# Patient Record
Sex: Male | Born: 1980 | Race: White | Hispanic: No | Marital: Single | Smoking: Never smoker
Health system: Southern US, Community
[De-identification: ages and names within clinical notes are randomized; demographics above are authoritative.]

## PROBLEM LIST (undated history)

## (undated) HISTORY — PX: NO PAST SURGERIES: SHX2092

---

## 2013-11-11 ENCOUNTER — Other Ambulatory Visit: Payer: Self-pay | Admitting: Occupational Medicine

## 2013-11-11 ENCOUNTER — Ambulatory Visit
Admission: RE | Admit: 2013-11-11 | Discharge: 2013-11-11 | Disposition: A | Discharge status: Home / Self Care | Payer: No Typology Code available for payment source | Source: Ambulatory Visit | Attending: Occupational Medicine | Admitting: Occupational Medicine

## 2013-11-11 DIAGNOSIS — Z021 Encounter for pre-employment examination: Secondary | ICD-10-CM

## 2015-04-16 IMAGING — CR DG CHEST 1V
1 series · 1 of 1 positions shown · non-contrast
Comparison: None.

CLINICAL DATA: CXR smoker.  Chest x-ray for physical exam.

EXAM:
CHEST - 1 VIEW

[view not recorded]
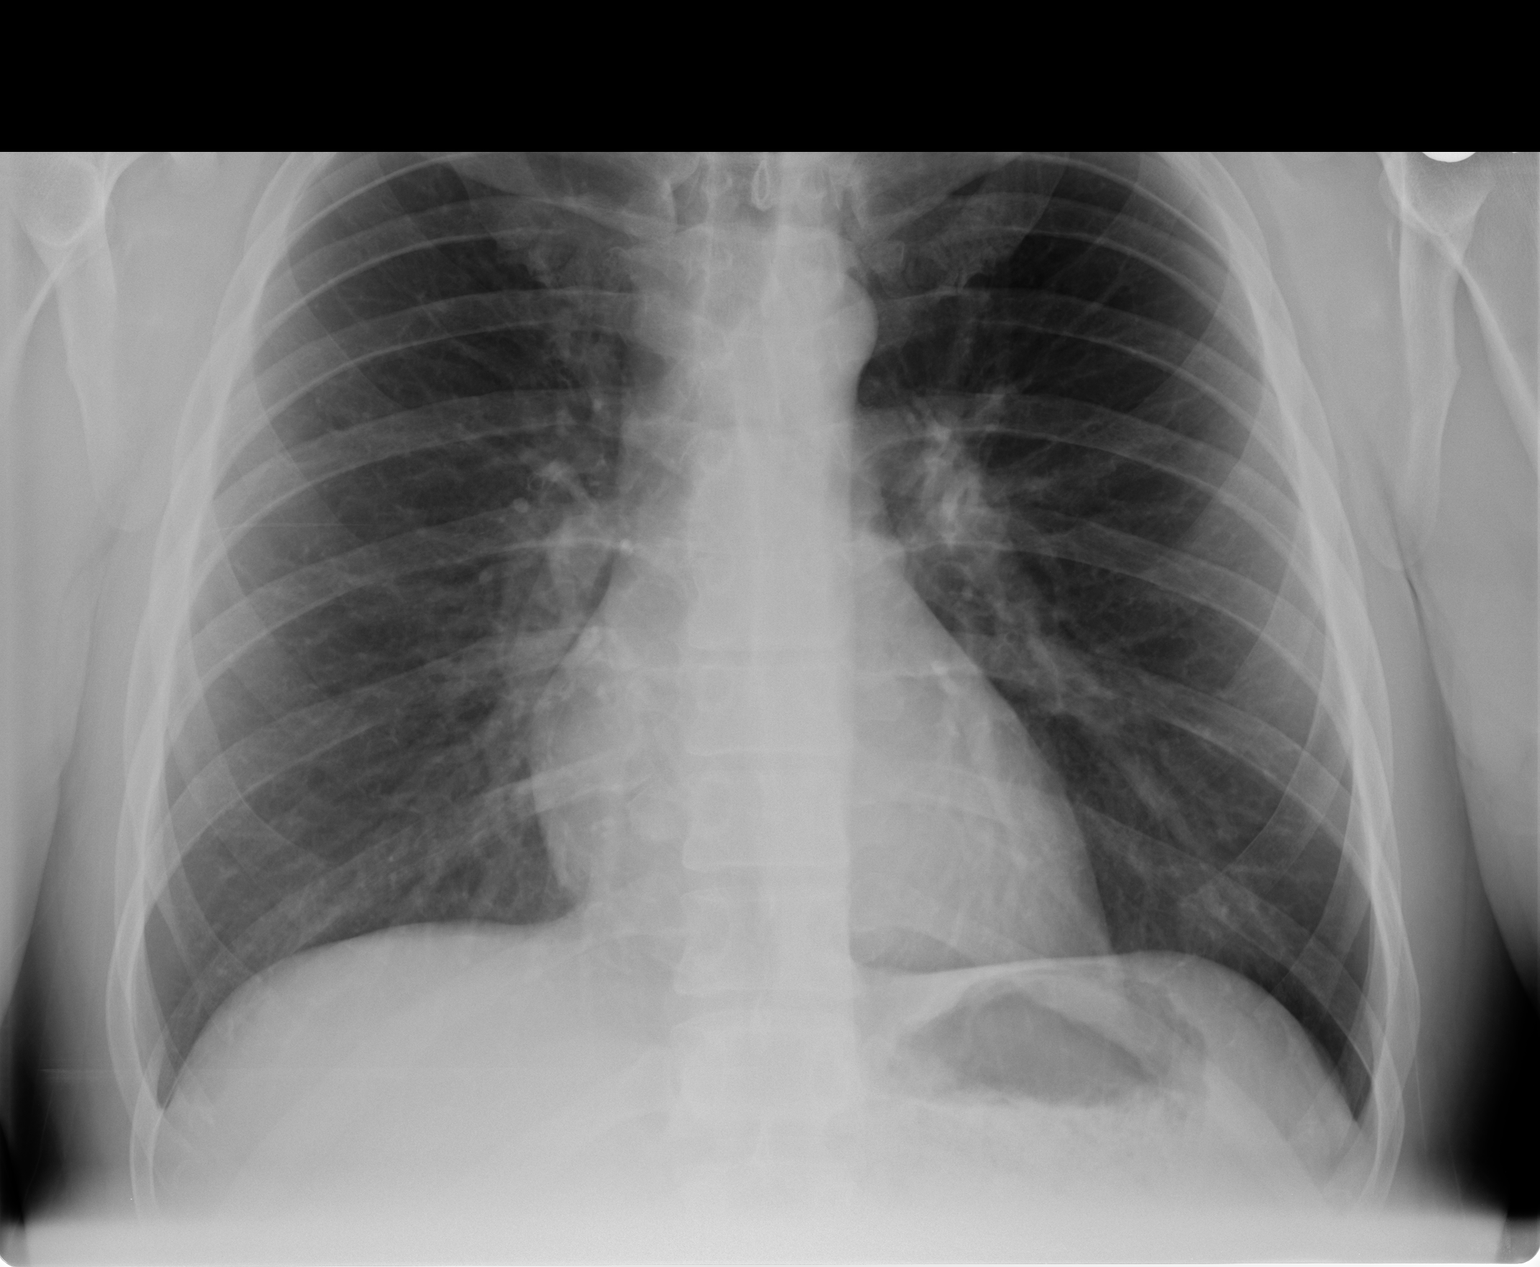

[1 of 1 positions shown; findings below may reference images not displayed]

FINDINGS: The heart size and mediastinal contours are within normal limits.
Both lungs are clear. The visualized skeletal structures are
unremarkable.
IMPRESSION: No active disease.

## 2017-03-31 ENCOUNTER — Ambulatory Visit: Payer: Self-pay

## 2017-03-31 ENCOUNTER — Other Ambulatory Visit: Payer: Self-pay | Admitting: Occupational Medicine

## 2017-03-31 DIAGNOSIS — M79645 Pain in left finger(s): Secondary | ICD-10-CM

## 2018-09-03 IMAGING — DX DG FINGER MIDDLE 2+V*L*
3 series · 3 of 3 positions shown · non-contrast
Comparison: None.

CLINICAL DATA: Left middle finger pain after twisting injury.

EXAM:
LEFT MIDDLE FINGER 2+V

[finger pa]
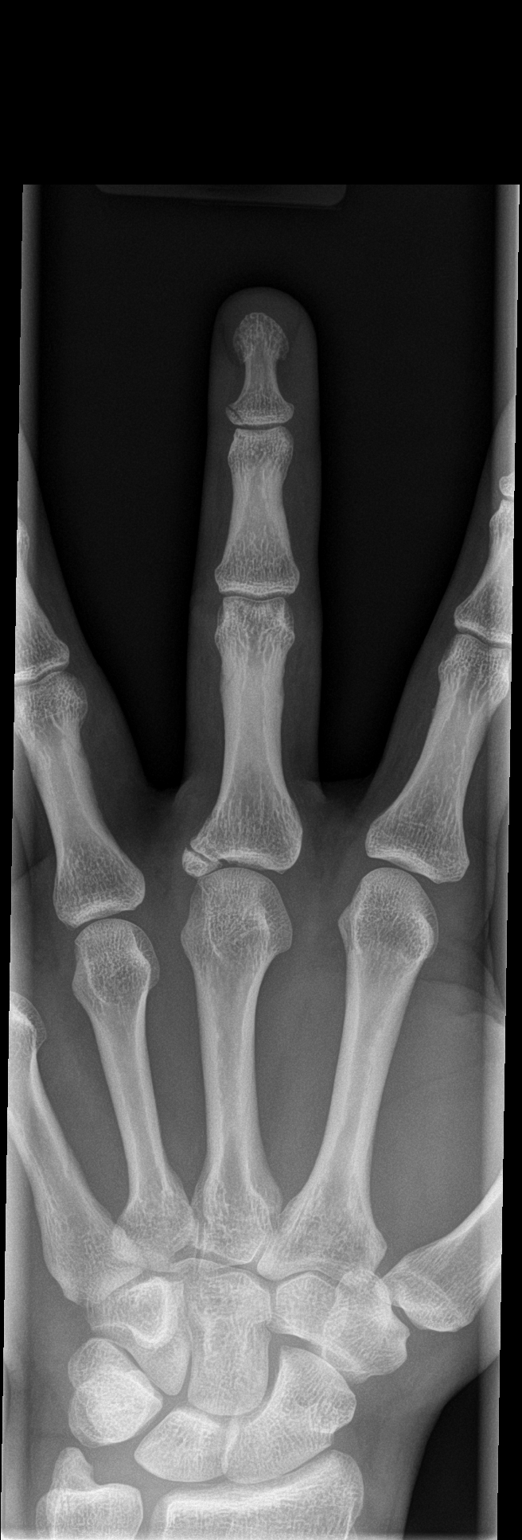

[finger obl]
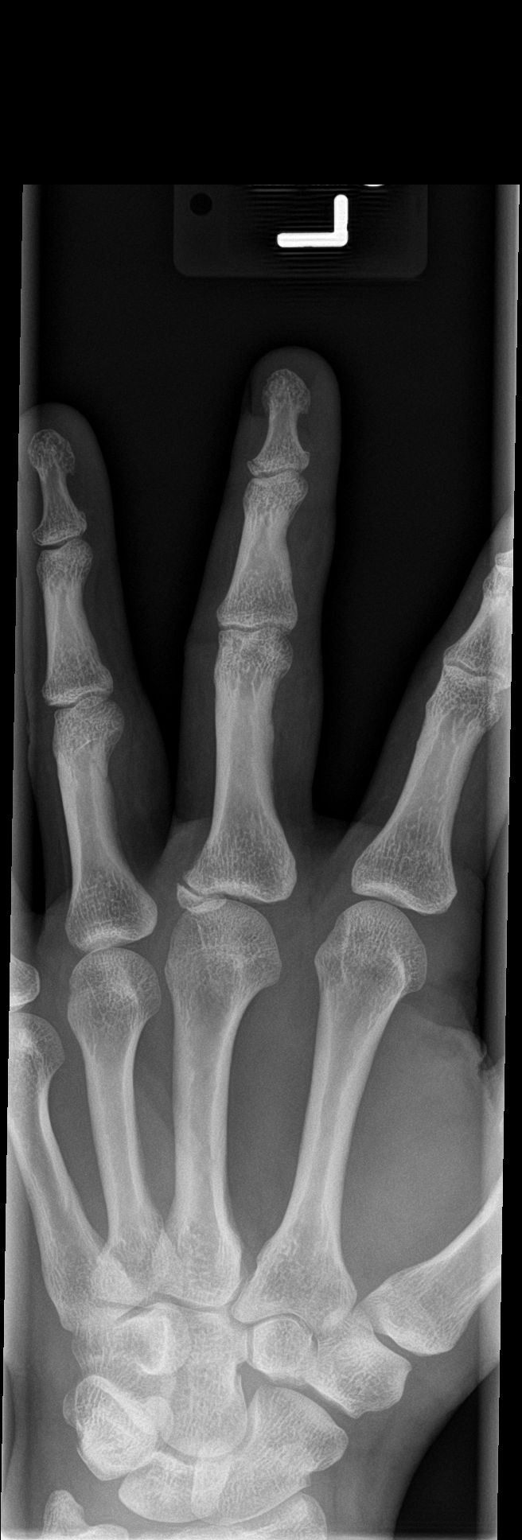

[finger lat]
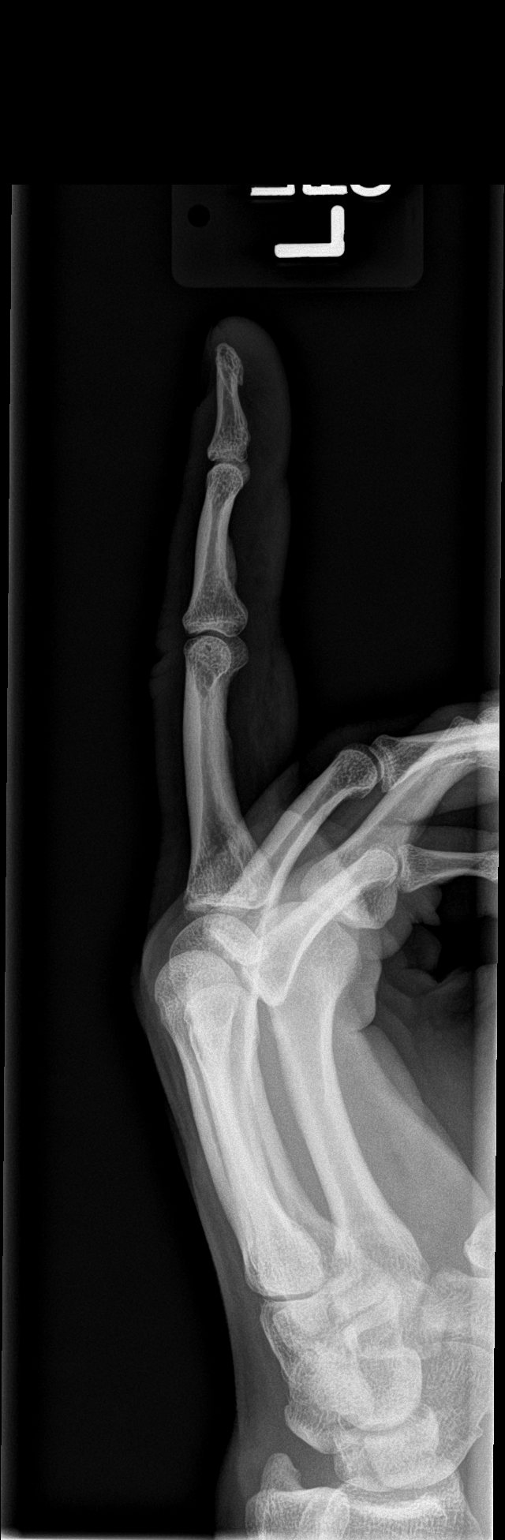

[3 of 3 positions shown; findings below may reference images not displayed]

FINDINGS: There is an acute, nondisplaced, intra-articular fracture involving
the ulnar base of the middle finger distal phalanx. There is a well
corticated ossific density at the ulnar base of the middle finger
proximal phalanx. Joint spaces are preserved. Bone mineralization is
normal. Soft tissues are unremarkable.
IMPRESSION: 1. Acute, nondisplaced, intra-articular fracture at the ulnar base
of the middle finger distal phalanx.
2. Well corticated ossific density at the ulnar base of the middle
finger proximal phalanx is favored to be related to prior trauma.
Correlate with point tenderness.

## 2021-08-13 ENCOUNTER — Encounter: Payer: Self-pay | Admitting: Internal Medicine

## 2021-08-24 ENCOUNTER — Encounter: Payer: Self-pay | Admitting: Internal Medicine

## 2021-09-12 ENCOUNTER — Other Ambulatory Visit (INDEPENDENT_AMBULATORY_CARE_PROVIDER_SITE_OTHER): Payer: 59

## 2021-09-12 ENCOUNTER — Encounter: Payer: Self-pay | Admitting: Gastroenterology

## 2021-09-12 ENCOUNTER — Ambulatory Visit: Payer: 59 | Admitting: Gastroenterology

## 2021-09-12 VITALS — BP 136/88 | HR 79 | Ht 73.5 in | Wt 256.6 lb

## 2021-09-12 DIAGNOSIS — R1013 Epigastric pain: Secondary | ICD-10-CM

## 2021-09-12 LAB — H. PYLORI ANTIBODY, IGG: H Pylori IgG: NEGATIVE

## 2021-09-12 MED ORDER — DICYCLOMINE HCL 10 MG PO CAPS: 20.0000 mg | ORAL_CAPSULE | Freq: Four times a day (QID) | ORAL | 0 refills | Status: AC

## 2021-09-12 NOTE — Progress Notes (Signed)
HPI : Andrew Goodwin is a very pleasant 41 year old male with no significant past medical history who presents to our office today for further evaluation of recurrent upper abdominal pain.  Patient states that he first experienced the symptoms in 2009, and he was admitted to the hospital because of his severe pain.  He reports being told that he had sludge in his gallbladder, but was told that he did not need to have his gallbladder out.  Prior to the onset of this pain, he states that he had lost about 70 pounds in 3 months, and that he was told that his rapid weight loss probably contributed to his symptoms.  He he did not have any problems with abdominal pain until February of this year.  Since February, he has had 8 or 9 episodes of pain which she says are near identical to the pain that he had back in 2009.  His last episode was a couple of weeks ago and was characterized by severe upper abdominal pain in the epigastrium and right upper quadrant associated with profuse vomiting and retching.  Typically these symptoms will last for an hour or 2, but his most recent one lasted 10 hours.  He denies symptoms of fevers or chills.  He has not identified any dietary triggers for these attacks.  The pain is described as sharp and stabbing with a colicky component to it. In between these episodes, he denies any significant issues with abdominal discomfort or nausea, although since his last episode he has had a persistent vague mild upper abdominal discomfort that has not completely gone away. He denies any NSAID use. He reports that he has multiple family members that have had their gallbladders taken out. No lower GI symptoms.   History reviewed. No pertinent past medical history.   History reviewed. No pertinent surgical history. Family History  Problem Relation Age of Onset   Colon cancer Neg Hx    Esophageal cancer Neg Hx    Liver cancer Neg Hx    Pancreatic cancer Neg Hx    Rectal cancer Neg Hx     Social History   Tobacco Use   Smoking status: Never   Smokeless tobacco: Never  Vaping Use   Vaping Use: Never used  Substance Use Topics   Alcohol use: Yes    Alcohol/week: 2.0 standard drinks of alcohol    Types: 2 Standard drinks or equivalent per week    Comment: socially   Drug use: Never   No current outpatient medications on file.   No current facility-administered medications for this visit.   No Known Allergies   Review of Systems: All systems reviewed and negative except where noted in HPI.    No results found.  Physical Exam: BP 136/88 (BP Location: Left Arm, Patient Position: Sitting, Cuff Size: Normal)   Pulse 79   Ht 6' 1.5" (1.867 m)   Wt 256 lb 9.6 oz (116.4 kg)   SpO2 96%   BMI 33.40 kg/m  Constitutional: Pleasant,well-developed, Caucasian male in no acute distress. HEENT: Normocephalic and atraumatic. Conjunctivae are normal. No scleral icterus. Neck supple.  Cardiovascular: Normal rate, regular rhythm.  Pulmonary/chest: Effort normal and breath sounds normal. No wheezing, rales or rhonchi. Abdominal: Soft, nondistended, nontender. Bowel sounds active throughout. There are no masses palpable. No hepatomegaly. Extremities: no edema Neurological: Alert and oriented to person place and time. Skin: Skin is warm and dry. No rashes noted. Psychiatric: Normal mood and affect. Behavior is normal.  CBC  No results found for: "WBC", "RBC", "HGB", "HCT", "PLT", "MCV", "MCH", "MCHC", "RDW", "LYMPHSABS", "MONOABS", "EOSABS", "BASOSABS"  CMP  No results found for: "NA", "K", "CL", "CO2", "GLUCOSE", "BUN", "CREATININE", "CALCIUM", "PROT", "ALBUMIN", "AST", "ALT", "ALKPHOS", "BILITOT", "GFRNONAA", "GFRAA"   ASSESSMENT AND PLAN: 41 year old male with recurring episodes of severe epigastric/right upper quadrant pain associated with nausea and vomiting.  He has a history of similar symptoms 14 years ago which was attributed to gallbladder sludge.  Strongly  suspect his current symptoms are biliary in etiology.  We will get right upper quadrant ultrasound.  In the meantime will prescribe Bentyl as needed for his attacks.  We will get baseline labs (CBC, CMP, lipase) as well as H. pylori serology.  Epigastric pain, n/v, suspect GB - RUQUS - Bentyl - H pylori IgM - CBC, CMP, lipase  Yuridia Couts E. Tomasa Rand, MD [facility]    No ref. provider found

## 2021-09-12 NOTE — Patient Instructions (Signed)
Please go the basement lab when leaving today.   We have sent Bentyl to your pharmacy.   The schedulers will call you to set up the Ultrasound.  Please call [phone] if you have not heard from them in 1 week.

## 2021-09-17 ENCOUNTER — Ambulatory Visit (HOSPITAL_COMMUNITY): Admission: RE | Admit: 2021-09-17 | Payer: 59 | Source: Ambulatory Visit

## 2021-09-17 ENCOUNTER — Ambulatory Visit: Payer: Self-pay | Admitting: Internal Medicine

## 2021-09-19 ENCOUNTER — Ambulatory Visit (HOSPITAL_COMMUNITY)
Admission: RE | Admit: 2021-09-19 | Discharge: 2021-09-19 | Disposition: A | Discharge status: Home / Self Care | Payer: 59 | Source: Ambulatory Visit | Attending: Gastroenterology | Admitting: Gastroenterology

## 2021-09-19 DIAGNOSIS — R1013 Epigastric pain: Secondary | ICD-10-CM | POA: Diagnosis present

## 2021-09-19 NOTE — Progress Notes (Signed)
Andrew Goodwin,  Please contact Mr. Dimario and let him know that his US showed gallstones which are the likely cause of his abdominal pain. Please place a general surgery consult for symptomatic cholelithiasis.  Please also inform him that he has fatty change to his liver which can be related to excessive alcohol use or can be related to a diet with excessive sugar/fat.  I advise him to decrease alcohol consumption and limit consumption of sugar-based foods/beverage

## 2021-10-11 ENCOUNTER — Ambulatory Visit: Payer: Self-pay | Admitting: Surgery

## 2021-10-24 NOTE — Patient Instructions (Signed)
SURGICAL WAITING ROOM VISITATION Patients having surgery or a procedure may have no more than 2 support people in the waiting area - these visitors may rotate.   Children under the age of 9 must have an adult with them who is not the patient. If the patient needs to stay at the hospital during part of their recovery, the visitor guidelines for inpatient rooms apply. Pre-op nurse will coordinate an appropriate time for 1 support person to accompany patient in pre-op.  This support person may not rotate.    Please refer to the [facility] website for the visitor guidelines for Inpatients (after your surgery is over and you are in a regular room).    Your procedure is scheduled on: 11/05/21   Report to [facility] Main Entrance    Report to admitting at 5:15 AM   Call this number if you have problems the morning of surgery [phone]   Do not eat food :After Midnight.   After Midnight you may have the following liquids until 4:30 AM DAY OF SURGERY  Water Non-Citrus Juices (without pulp, NO RED) Carbonated Beverages Black Coffee (NO MILK/CREAM OR CREAMERS, sugar ok)  Clear Tea (NO MILK/CREAM OR CREAMERS, sugar ok) regular and decaf                             Plain Jell-O (NO RED)                                           Fruit ices (not with fruit pulp, NO RED)                                     Popsicles (NO RED)                                                               Sports drinks like Gatorade (NO RED)                      If you have questions, please contact your surgeon's office.   FOLLOW BOWEL PREP AND ANY ADDITIONAL PRE OP INSTRUCTIONS YOU RECEIVED FROM YOUR SURGEON'S OFFICE!!!     Oral Hygiene is also important to reduce your risk of infection.                                    Remember - BRUSH YOUR TEETH THE MORNING OF SURGERY WITH YOUR REGULAR TOOTHPASTE   Take these medicines the morning of surgery with A SIP OF WATER: None                                You may not have any metal on your body including jewelry, and body piercing             Do not wear lotions, powders, cologne, or deodorant  Men may shave face and neck.   Do not bring valuables to the hospital. [place].  DO NOT [place]. PHARMACY WILL DISPENSE MEDICATIONS LISTED ON YOUR MEDICATION LIST TO YOU DURING YOUR ADMISSION [place]!    Patients discharged on the day of surgery will not be allowed to drive home.  Someone NEEDS to stay with you for the first 24 hours after anesthesia.   Special Instructions: Bring a copy of your healthcare power of attorney and living will documents the day of surgery if you haven't scanned them before.              Please read over the following fact sheets you were given: IF [place] [phone] Schneiders   If you received a COVID test during your pre-op visit  it is requested that you wear a mask when out in public, stay away from anyone that may not be feeling well and notify your surgeon if you develop symptoms. If you test positive for Covid or have been in contact with anyone that has tested positive in the last 10 days please notify you surgeon.     [facility] - Preparing for Surgery Before surgery, you can play an important role.  Because skin is not sterile, your skin needs to be as free of germs as possible.  You can reduce the number of germs on your skin by washing with CHG (chlorahexidine gluconate) soap before surgery.  CHG is an antiseptic cleaner which kills germs and bonds with the skin to continue killing germs even after washing. Please DO NOT use if you have an allergy to CHG or antibacterial soaps.  If your skin becomes reddened/irritated stop using the CHG and inform your nurse when you arrive at Short Stay. Do not shave (including legs and underarms) for at least 48  hours prior to the first CHG shower.  You may shave your face/neck.  Please follow these instructions carefully:  1.  Shower with CHG Soap the night before surgery and the  morning of surgery.  2.  If you choose to wash your hair, wash your hair first as usual with your normal  shampoo.  3.  After you shampoo, rinse your hair and body thoroughly to remove the shampoo.                             4.  Use CHG as you would any other liquid soap.  You can apply chg directly to the skin and wash.  Gently with a scrungie or clean washcloth.  5.  Apply the CHG Soap to your body ONLY FROM THE NECK DOWN.   Do   not use on face/ open                           Wound or open sores. Avoid contact with eyes, ears mouth and   genitals (private parts).                       Wash face,  Genitals (private parts) with your normal soap.             6.  Wash thoroughly, paying special attention to the area where  your    surgery  will be performed.  7.  Thoroughly rinse your body with warm water from the neck down.  8.  DO NOT shower/wash with your normal soap after using and rinsing off the CHG Soap.                9.  Pat yourself dry with a clean towel.            10.  Wear clean pajamas.            11.  Place clean sheets on your bed the night of your first shower and do not  sleep with pets. Day of Surgery : Do not apply any lotions/deodorants the morning of surgery.  Please wear clean clothes to the hospital/surgery center.  FAILURE TO FOLLOW THESE INSTRUCTIONS MAY RESULT IN THE CANCELLATION OF YOUR SURGERY  PATIENT SIGNATURE_________________________________  NURSE SIGNATURE__________________________________  ________________________________________________________________________

## 2021-10-24 NOTE — Progress Notes (Signed)
COVID Vaccine Completed:  Date of COVID positive in last 90 days:  PCP -  Cardiologist -   Chest x-ray -  EKG -  Stress Test -  ECHO -  Cardiac Cath -  Pacemaker/ICD device last checked: Spinal Cord Stimulator:  Bowel Prep -   Sleep Study -  CPAP -   Fasting Blood Sugar - pre DM? Checks Blood Sugar _____ times a day  Blood Thinner Instructions: Aspirin Instructions: Last Dose:  Activity level:  Can go up a flight of stairs and perform activities of daily living without stopping and without symptoms of chest pain or shortness of breath.  Able to exercise without symptoms  Unable to go up a flight of stairs without symptoms of     Anesthesia review:   Patient denies shortness of breath, fever, cough and chest pain at PAT appointment  Patient verbalized understanding of instructions that were given to them at the PAT appointment. Patient was also instructed that they will need to review over the PAT instructions again at home before surgery.

## 2021-10-25 ENCOUNTER — Encounter (HOSPITAL_COMMUNITY): Payer: Self-pay

## 2021-10-25 ENCOUNTER — Encounter (HOSPITAL_COMMUNITY)
Admission: RE | Admit: 2021-10-25 | Discharge: 2021-10-25 | Disposition: A | Discharge status: Home / Self Care | Payer: 59 | Source: Ambulatory Visit | Attending: Surgery | Admitting: Surgery

## 2021-10-25 VITALS — BP 124/90 | HR 68 | Temp 98.4°F | Resp 16 | Ht 73.0 in | Wt 255.0 lb

## 2021-10-25 DIAGNOSIS — Z01818 Encounter for other preprocedural examination: Secondary | ICD-10-CM | POA: Diagnosis present

## 2021-10-25 DIAGNOSIS — R7303 Prediabetes: Secondary | ICD-10-CM | POA: Insufficient documentation

## 2021-10-25 LAB — CBC
HCT: 46.2 % (ref 39.0–52.0)
Hemoglobin: 16.4 g/dL (ref 13.0–17.0)
MCH: 32.2 pg (ref 26.0–34.0)
MCHC: 35.5 g/dL (ref 30.0–36.0)
MCV: 90.8 fL (ref 80.0–100.0)
Platelets: 255 10*3/uL (ref 150–400)
RBC: 5.09 MIL/uL (ref 4.22–5.81)
RDW: 11.7 % (ref 11.5–15.5)
WBC: 4.3 10*3/uL (ref 4.0–10.5)
nRBC: 0 % (ref 0.0–0.2)

## 2021-11-03 NOTE — Anesthesia Preprocedure Evaluation (Addendum)
Anesthesia Evaluation  Patient identified by MRN, date of birth, ID band Patient awake    Reviewed: Allergy & Precautions, NPO status , Patient's Chart, lab work & pertinent test results  Airway Mallampati: II  TM Distance: >3 FB Neck ROM: Full    Dental no notable dental hx. (+) Teeth Intact, Dental Advisory Given   Pulmonary neg pulmonary ROS,    Pulmonary exam normal breath sounds clear to auscultation       Cardiovascular Normal cardiovascular exam Rhythm:Regular Rate:Normal     Neuro/Psych negative neurological ROS  negative psych ROS   GI/Hepatic negative GI ROS, Neg liver ROS,   Endo/Other  negative endocrine ROS  Renal/GU negative Renal ROS     Musculoskeletal negative musculoskeletal ROS (+)   Abdominal (+) + obese (BMi 33.6),   Peds  Hematology Lab Results      Component                Value               Date                      HGB                      16.4                10/25/2021                HCT                      46.2                10/25/2021                  Anesthesia Other Findings   Reproductive/Obstetrics                            Anesthesia Physical Anesthesia Plan  ASA: 2  Anesthesia Plan: General   Post-op Pain Management: Precedex, Toradol IV (intra-op)*, Tylenol PO (pre-op)* and Dilaudid IV   Induction: Intravenous  PONV Risk Score and Plan: 3 and Treatment may vary due to age or medical condition, Midazolam and Ondansetron  Airway Management Planned: Oral ETT  Additional Equipment: None  Intra-op Plan:   Post-operative Plan: Extubation in OR  Informed Consent: I have reviewed the patients History and Physical, chart, labs and discussed the procedure including the risks, benefits and alternatives for the proposed anesthesia with the patient or authorized representative who has indicated his/her understanding and acceptance.     Dental  advisory given  Plan Discussed with: CRNA  Anesthesia Plan Comments:        Anesthesia Quick Evaluation

## 2021-11-05 ENCOUNTER — Ambulatory Visit (HOSPITAL_BASED_OUTPATIENT_CLINIC_OR_DEPARTMENT_OTHER): Payer: 59 | Admitting: Anesthesiology

## 2021-11-05 ENCOUNTER — Encounter (HOSPITAL_COMMUNITY): Payer: Self-pay | Admitting: Surgery

## 2021-11-05 ENCOUNTER — Other Ambulatory Visit: Payer: Self-pay

## 2021-11-05 ENCOUNTER — Ambulatory Visit (HOSPITAL_COMMUNITY): Payer: 59 | Admitting: Anesthesiology

## 2021-11-05 ENCOUNTER — Encounter (HOSPITAL_COMMUNITY)
Admission: RE | Disposition: A | Discharge status: Home / Self Care | Payer: Self-pay | Source: Ambulatory Visit | Attending: Surgery

## 2021-11-05 ENCOUNTER — Ambulatory Visit (HOSPITAL_COMMUNITY)
Admission: RE | Admit: 2021-11-05 | Discharge: 2021-11-05 | Disposition: A | Discharge status: Home / Self Care | Payer: 59 | Source: Ambulatory Visit | Attending: Surgery | Admitting: Surgery

## 2021-11-05 DIAGNOSIS — R7303 Prediabetes: Secondary | ICD-10-CM | POA: Insufficient documentation

## 2021-11-05 DIAGNOSIS — K8020 Calculus of gallbladder without cholecystitis without obstruction: Secondary | ICD-10-CM | POA: Diagnosis present

## 2021-11-05 DIAGNOSIS — K8010 Calculus of gallbladder with chronic cholecystitis without obstruction: Secondary | ICD-10-CM | POA: Diagnosis not present

## 2021-11-05 HISTORY — PX: CHOLECYSTECTOMY: SHX55

## 2021-11-05 SURGERY — LAPAROSCOPIC CHOLECYSTECTOMY
Anesthesia: General

## 2021-11-05 MED ORDER — FENTANYL CITRATE (PF) 100 MCG/2ML IJ SOLN
INTRAMUSCULAR | Status: AC
  Filled 2021-11-05: qty 2

## 2021-11-05 MED ORDER — ONDANSETRON HCL 4 MG/2ML IJ SOLN
INTRAMUSCULAR | Status: DC | PRN
  Administered 2021-11-05: 4 mg via INTRAVENOUS

## 2021-11-05 MED ORDER — KETOROLAC TROMETHAMINE 30 MG/ML IJ SOLN: 30.0000 mg | Freq: Once | INTRAMUSCULAR | Status: DC | PRN

## 2021-11-05 MED ORDER — ENOXAPARIN SODIUM 40 MG/0.4ML IJ SOSY
40.0000 mg | PREFILLED_SYRINGE | Freq: Once | INTRAMUSCULAR | Status: AC
  Administered 2021-11-05: 40 mg via SUBCUTANEOUS
  Filled 2021-11-05: qty 0.4

## 2021-11-05 MED ORDER — FENTANYL CITRATE (PF) 100 MCG/2ML IJ SOLN
INTRAMUSCULAR | Status: DC | PRN
  Administered 2021-11-05 (×3): 50 ug via INTRAVENOUS

## 2021-11-05 MED ORDER — OXYCODONE HCL 5 MG PO TABS: 5.0000 mg | ORAL_TABLET | Freq: Once | ORAL | Status: DC | PRN

## 2021-11-05 MED ORDER — GABAPENTIN 300 MG PO CAPS
300.0000 mg | ORAL_CAPSULE | ORAL | Status: AC
  Administered 2021-11-05: 300 mg via ORAL
  Filled 2021-11-05: qty 1

## 2021-11-05 MED ORDER — ONDANSETRON HCL 4 MG/2ML IJ SOLN
INTRAMUSCULAR | Status: AC
  Filled 2021-11-05: qty 2

## 2021-11-05 MED ORDER — PROPOFOL 10 MG/ML IV BOLUS
INTRAVENOUS | Status: AC
  Filled 2021-11-05: qty 20

## 2021-11-05 MED ORDER — CHLORHEXIDINE GLUCONATE CLOTH 2 % EX PADS: 6.0000 | MEDICATED_PAD | Freq: Once | CUTANEOUS | Status: DC

## 2021-11-05 MED ORDER — PROPOFOL 10 MG/ML IV BOLUS
INTRAVENOUS | Status: DC | PRN
  Administered 2021-11-05: 180 mg via INTRAVENOUS

## 2021-11-05 MED ORDER — ACETAMINOPHEN 500 MG PO TABS
1000.0000 mg | ORAL_TABLET | ORAL | Status: AC
  Administered 2021-11-05: 1000 mg via ORAL
  Filled 2021-11-05: qty 2

## 2021-11-05 MED ORDER — CHLORHEXIDINE GLUCONATE 0.12 % MT SOLN
15.0000 mL | Freq: Once | OROMUCOSAL | Status: AC
  Administered 2021-11-05: 15 mL via OROMUCOSAL

## 2021-11-05 MED ORDER — KETOROLAC TROMETHAMINE 30 MG/ML IJ SOLN
INTRAMUSCULAR | Status: DC | PRN
  Administered 2021-11-05: 15 mg via INTRAVENOUS

## 2021-11-05 MED ORDER — DEXMEDETOMIDINE HCL IN NACL 80 MCG/20ML IV SOLN
INTRAVENOUS | Status: DC | PRN
  Administered 2021-11-05: 8 ug via BUCCAL
  Administered 2021-11-05: 12 ug via BUCCAL

## 2021-11-05 MED ORDER — LIDOCAINE HCL (PF) 2 % IJ SOLN
INTRAMUSCULAR | Status: AC
  Filled 2021-11-05: qty 5

## 2021-11-05 MED ORDER — OXYCODONE HCL 5 MG/5ML PO SOLN: 5.0000 mg | Freq: Once | ORAL | Status: DC | PRN

## 2021-11-05 MED ORDER — BUPIVACAINE-EPINEPHRINE (PF) 0.25% -1:200000 IJ SOLN
INTRAMUSCULAR | Status: AC
  Filled 2021-11-05: qty 30

## 2021-11-05 MED ORDER — MIDAZOLAM HCL 5 MG/5ML IJ SOLN
INTRAMUSCULAR | Status: DC | PRN
  Administered 2021-11-05: 2 mg via INTRAVENOUS

## 2021-11-05 MED ORDER — CEFAZOLIN SODIUM-DEXTROSE 2-4 GM/100ML-% IV SOLN
2.0000 g | INTRAVENOUS | Status: AC
  Administered 2021-11-05: 2 g via INTRAVENOUS
  Filled 2021-11-05: qty 100

## 2021-11-05 MED ORDER — ROCURONIUM BROMIDE 10 MG/ML (PF) SYRINGE
PREFILLED_SYRINGE | INTRAVENOUS | Status: AC
  Filled 2021-11-05: qty 10

## 2021-11-05 MED ORDER — 0.9 % SODIUM CHLORIDE (POUR BTL) OPTIME
TOPICAL | Status: DC | PRN
  Administered 2021-11-05: 1000 mL

## 2021-11-05 MED ORDER — HYDROMORPHONE HCL 1 MG/ML IJ SOLN
INTRAMUSCULAR | Status: AC
  Filled 2021-11-05: qty 1

## 2021-11-05 MED ORDER — LACTATED RINGERS IV SOLN: INTRAVENOUS | Status: DC

## 2021-11-05 MED ORDER — DEXAMETHASONE SODIUM PHOSPHATE 10 MG/ML IJ SOLN
INTRAMUSCULAR | Status: AC
  Filled 2021-11-05: qty 1

## 2021-11-05 MED ORDER — ORAL CARE MOUTH RINSE: 15.0000 mL | Freq: Once | OROMUCOSAL | Status: AC

## 2021-11-05 MED ORDER — MIDAZOLAM HCL 2 MG/2ML IJ SOLN
INTRAMUSCULAR | Status: AC
  Filled 2021-11-05: qty 2

## 2021-11-05 MED ORDER — LIDOCAINE 2% (20 MG/ML) 5 ML SYRINGE
INTRAMUSCULAR | Status: DC | PRN
  Administered 2021-11-05: 100 mg via INTRAVENOUS

## 2021-11-05 MED ORDER — ONDANSETRON HCL 4 MG/2ML IJ SOLN: 4.0000 mg | Freq: Once | INTRAMUSCULAR | Status: DC | PRN

## 2021-11-05 MED ORDER — SUGAMMADEX SODIUM 500 MG/5ML IV SOLN
INTRAVENOUS | Status: DC | PRN
  Administered 2021-11-05: 400 mg via INTRAVENOUS

## 2021-11-05 MED ORDER — HYDROMORPHONE HCL 1 MG/ML IJ SOLN
0.2500 mg | INTRAMUSCULAR | Status: DC | PRN
  Administered 2021-11-05 (×2): 0.5 mg via INTRAVENOUS

## 2021-11-05 MED ORDER — DEXAMETHASONE SODIUM PHOSPHATE 10 MG/ML IJ SOLN
INTRAMUSCULAR | Status: DC | PRN
  Administered 2021-11-05: 8 mg via INTRAVENOUS

## 2021-11-05 MED ORDER — BUPIVACAINE LIPOSOME 1.3 % IJ SUSP: 20.0000 mL | Freq: Once | INTRAMUSCULAR | Status: DC

## 2021-11-05 MED ORDER — BUPIVACAINE-EPINEPHRINE 0.25% -1:200000 IJ SOLN
INTRAMUSCULAR | Status: DC | PRN
  Administered 2021-11-05: 30 mL

## 2021-11-05 MED ORDER — LACTATED RINGERS IV SOLN
INTRAVENOUS | Status: DC | PRN
  Administered 2021-11-05: 1000 mL

## 2021-11-05 MED ORDER — OXYCODONE-ACETAMINOPHEN 5-325 MG PO TABS: 1.0000 | ORAL_TABLET | ORAL | 0 refills | Status: AC | PRN

## 2021-11-05 MED ORDER — ROCURONIUM BROMIDE 10 MG/ML (PF) SYRINGE
PREFILLED_SYRINGE | INTRAVENOUS | Status: DC | PRN
  Administered 2021-11-05: 70 mg via INTRAVENOUS
  Administered 2021-11-05: 20 mg via INTRAVENOUS

## 2021-11-05 SURGICAL SUPPLY — 40 items
APPLIER CLIP ROT 10 11.4 M/L (STAPLE) ×1
BAG COUNTER SPONGE SURGICOUNT (BAG) IMPLANT
CABLE HIGH FREQUENCY MONO STRZ (ELECTRODE) ×1 IMPLANT
CATH URETL OPEN 5X70 (CATHETERS) IMPLANT
CHLORAPREP W/TINT 26 (MISCELLANEOUS) ×1 IMPLANT
CLIP APPLIE ROT 10 11.4 M/L (STAPLE) ×1 IMPLANT
COVER MAYO STAND XLG (MISCELLANEOUS) ×1 IMPLANT
COVER SURGICAL LIGHT HANDLE (MISCELLANEOUS) ×1 IMPLANT
DERMABOND ADVANCED .7 DNX12 (GAUZE/BANDAGES/DRESSINGS) ×1 IMPLANT
DRAPE C-ARM 42X120 X-RAY (DRAPES) IMPLANT
ELECT REM PT RETURN 15FT ADLT (MISCELLANEOUS) ×1 IMPLANT
ENDOLOOP SUT PDS II  0 18 (SUTURE) ×1
ENDOLOOP SUT PDS II 0 18 (SUTURE) ×1 IMPLANT
GLOVE BIO SURGEON STRL SZ7.5 (GLOVE) ×1 IMPLANT
GLOVE BIOGEL PI IND STRL 8 (GLOVE) ×1 IMPLANT
GOWN STRL REUS W/ TWL XL LVL3 (GOWN DISPOSABLE) ×2 IMPLANT
GOWN STRL REUS W/TWL XL LVL3 (GOWN DISPOSABLE) ×2
GRASPER SUT TROCAR 14GX15 (MISCELLANEOUS) IMPLANT
HEMOSTAT SNOW SURGICEL 2X4 (HEMOSTASIS) IMPLANT
IRRIG SUCT STRYKERFLOW 2 WTIP (MISCELLANEOUS) ×1
IRRIGATION SUCT STRKRFLW 2 WTP (MISCELLANEOUS) ×1 IMPLANT
IV CATH 14GX2 1/4 (CATHETERS) ×1 IMPLANT
KIT BASIN OR (CUSTOM PROCEDURE TRAY) ×1 IMPLANT
KIT TURNOVER KIT A (KITS) IMPLANT
NDL INSUFFLATION 14GA 120MM (NEEDLE) ×1 IMPLANT
NEEDLE INSUFFLATION 14GA 120MM (NEEDLE) ×1 IMPLANT
PENCIL SMOKE EVACUATOR (MISCELLANEOUS) IMPLANT
POUCH RETRIEVAL ECOSAC 10 (ENDOMECHANICALS) ×1 IMPLANT
POUCH RETRIEVAL ECOSAC 10MM (ENDOMECHANICALS) ×1
SCISSORS LAP 5X35 DISP (ENDOMECHANICALS) ×1 IMPLANT
SET TUBE SMOKE EVAC HIGH FLOW (TUBING) ×1 IMPLANT
SLEEVE Z-THREAD 5X100MM (TROCAR) ×2 IMPLANT
SPIKE FLUID TRANSFER (MISCELLANEOUS) ×1 IMPLANT
STOPCOCK 4 WAY LG BORE MALE ST (IV SETS) IMPLANT
SUT MNCRL AB 4-0 PS2 18 (SUTURE) ×1 IMPLANT
TOWEL OR 17X26 10 PK STRL BLUE (TOWEL DISPOSABLE) ×1 IMPLANT
TOWEL OR NON WOVEN STRL DISP B (DISPOSABLE) IMPLANT
TRAY LAPAROSCOPIC (CUSTOM PROCEDURE TRAY) ×1 IMPLANT
TROCAR ADV FIXATION 12X100MM (TROCAR) ×1 IMPLANT
TROCAR Z-THREAD OPTICAL 5X100M (TROCAR) ×1 IMPLANT

## 2021-11-05 NOTE — Addendum Note (Signed)
Addendum  created 11/05/21 1023 by Barnet Glasgow, MD   Clinical Note Signed

## 2021-11-05 NOTE — Op Note (Signed)
Patient: Andrew Goodwin ([birth year], [id number])  Date of Surgery: 11/05/2021   Preoperative Diagnosis: SYMPTOMATIC CHOLELITHIASIS   Postoperative Diagnosis: SYMPTOMATIC CHOLELITHIASIS   Surgical Procedure: LAPAROSCOPIC CHOLECYSTECTOMY:    Operative Team Members:  Surgeon(s) and Role:    * Avory Rahimi, Hyman Hopes, MD - Primary    * Maczis, Hedda Slade, [place] - Assisting   Anesthesiologist: Trevor Iha, MD CRNA: Wynonia Sours, CRNA   Anesthesia: General   Fluids:  Total I/O In: 100 [IV Piggyback:100] Out: -   Complications: * No complications entered in OR log *  Drains:  none   Specimen:  ID Type Source Tests Collected by Time Destination  1 : Gallbladder Tissue PATH Gallbladder SURGICAL PATHOLOGY Garrett Bowring, Hyman Hopes, MD 11/05/2021 [phone]      Disposition:  PACU - hemodynamically stable.  Plan of Care: Discharge to home after PACU    Indications for Procedure: Andrew Goodwin is a 41 y.o. male who presented with abdominal pain.  History, physical and imaging was concerning for symptomatic cholelithiasis.  Laparoscopic cholecystectomy was recommended for the patient.  The procedure itself, as well as the risks, benefits and alternatives were discussed with the patient.  Risks discussed included but were not limited to the risk of infection, bleeding, damage to nearby structures, need to convert to open procedure, incisional hernia, bile leak, common bile duct injury and the need for additional procedures or surgeries.  With this discussion complete and all questions answered the patient granted consent to proceed.  Findings: Gallstones  Infection status: Patient: Private Patient Elective Case Case: Elective Infection Present At Time Of Surgery (PATOS):  some spillage of bile   Description of Procedure:   On the date stated above, the patient was taken to the operating room suite and placed in supine positioning.  Sequential compression devices were placed on the  lower extremities to prevent blood clots.  General endotracheal anesthesia was induced. Preoperative antibiotics were given.  The patient's abdomen was prepped and draped in the usual sterile fashion.  A time-out was completed verifying the correct patient, procedure, positioning and equipment needed for the case.  We began by anesthetizing the skin with local anesthetic and then making a 5 mm incision just below the umbilicus.  We dissected through the subcutaneous tissues to the fascia.  The fascia was grasped and elevated using a Kocher clamp.  A Veress needle was inserted into the abdomen and the abdomen was insufflated to 15 mmHg.  A 5 mm trocar was inserted in this position under optical guidance and then the abdomen was inspected.  There was no trauma to the underlying viscera with initial trocar placement.  Any abnormal findings, other than inflammation in the right upper quadrant, are listed above in the findings section.  Three additional trocars were placed, one 12 mm trocar in the subxiphoid position, one 5 mm trocar in the midline epigastric area and one 74mm trocar in the right upper quadrant subcostally.  These were placed under direct vision without any trauma to the underlying viscera.    The patient was then placed in head up, left side down positioning.  The gallbladder was identified and dissected free from its attachments to the omentum allowing the duodenum to fall away.  The infundibulum of the gallbladder was dissected free working laterally to medially.  The cystic duct and cystic artery were dissected free from surrounding connective tissue.  The infundibulum of the gallbladder was dissected off the cystic plate.  A critical view of safety  was obtained with the cystic duct and cystic artery being cleared of connective tissues and clearly the only two structures entering into the gallbladder with the liver clearly visible behind.  Clips were then applied to the cystic duct and cystic  artery and then these structures were divided.  The gallbladder was dissected off the cystic plate, placed in an endocatch bag and removed from the 12 mm subxiphoid port site.  The clips were inspected and appeared effective.  The cystic plate was inspected and hemostasis was obtained using electrocautery.  A suction irrigator was used to clean the operative field.  Attention was turned to closure.  The 12 mm subxiphoid port site was closed using a 0-vicryl suture on a fascial suture passer.  The abdomen was desufflated.  The skin was closed using 4-0 monocryl and dermabond.  All sponge and needle counts were correct at the conclusion of the case.    Louanna Raw, MD General, Bariatric, & Minimally Invasive Surgery [facility], [place]

## 2021-11-05 NOTE — Anesthesia Postprocedure Evaluation (Signed)
Anesthesia Post Note  Patient: Andrew Goodwin  Procedure(s) Performed: LAPAROSCOPIC CHOLECYSTECTOMY     Patient location during evaluation: PACU Anesthesia Type: General Level of consciousness: awake and alert Pain management: pain level controlled Vital Signs Assessment: post-procedure vital signs reviewed and stable Respiratory status: spontaneous breathing, nonlabored ventilation, respiratory function stable and patient connected to nasal cannula oxygen Cardiovascular status: blood pressure returned to baseline and stable Postop Assessment: no apparent nausea or vomiting Anesthetic complications: no   No notable events documented.  Last Vitals:  Vitals:   11/05/21 0900 11/05/21 0930  BP: (!) 138/92 (!) 140/85  Pulse: 64 (!) 49  Resp: 11   Temp: (!) 36.3 C   SpO2: 98% 96%    Last Pain:  Vitals:   11/05/21 0930  TempSrc:   PainSc: 0-No pain                 Jadan Hinojos A Laniqua Torrens     

## 2021-11-05 NOTE — Anesthesia Postprocedure Evaluation (Signed)
Anesthesia Post Note  Patient: Arron Totty  Procedure(s) Performed: LAPAROSCOPIC CHOLECYSTECTOMY     Patient location during evaluation: PACU Anesthesia Type: General Level of consciousness: awake and alert Pain management: pain level controlled Vital Signs Assessment: post-procedure vital signs reviewed and stable Respiratory status: spontaneous breathing, nonlabored ventilation, respiratory function stable and patient connected to nasal cannula oxygen Cardiovascular status: blood pressure returned to baseline and stable Postop Assessment: no apparent nausea or vomiting Anesthetic complications: no   No notable events documented.  Last Vitals:  Vitals:   11/05/21 0900 11/05/21 0930  BP: (!) 138/92 (!) 140/85  Pulse: 64 (!) 49  Resp: 11   Temp: (!) 36.3 C   SpO2: 98% 96%    Last Pain:  Vitals:   11/05/21 0930  TempSrc:   PainSc: 0-No pain                 Barnet Glasgow

## 2021-11-05 NOTE — Transfer of Care (Signed)
Immediate Anesthesia Transfer of Care Note  Patient: Andrew Goodwin  Procedure(s) Performed: LAPAROSCOPIC CHOLECYSTECTOMY  Patient Location: PACU  Anesthesia Type:General  Level of Consciousness: drowsy and patient cooperative  Airway & Oxygen Therapy: Patient Spontanous Breathing and Patient connected to face mask oxygen  Post-op Assessment: Report given to RN and Post -op Vital signs reviewed and stable  Post vital signs: Reviewed and stable  Last Vitals:  Vitals Value Taken Time  BP    Temp    Pulse 70 11/05/21 0828  Resp 12 11/05/21 0828  SpO2 100 % 11/05/21 0828  Vitals shown include unvalidated device data.  Last Pain:  Vitals:   11/05/21 0541  TempSrc: Oral  PainSc: 0-No pain         Complications: No notable events documented.

## 2021-11-05 NOTE — Discharge Instructions (Signed)
 CHOLECYSTECTOMY POST OPERATIVE INSTRUCTIONS  Thinking Clearly  The anesthesia may cause you to feel different for 1 or 2 days. Do not drive, drink alcohol, or make any big decisions for at least 2 days.  Nutrition When you wake up, you will be able to drink small amounts of liquid. If you do not feel sick, you can slowly advance your diet to regular foods. Continue to drink lots of fluids, usually about 8 to 10 glasses per day. Eat a high-fiber diet so you don't strain during bowel movements. High-Fiber Foods Foods high in fiber include beans, bran cereals and whole-grain breads, peas, dried fruit (figs, apricots, and dates), raspberries, blackberries, strawberries, sweet corn, broccoli, baked potatoes with skin, plums, pears, apples, greens, and nuts. Activity Slowly increase your activity. Be sure to get up and walk every hour or so to prevent blood clots. No heavy lifting or strenuous activity for 4 weeks following surgery to prevent hernias at your incision sites It is normal to feel tired. You may need more sleep than usual.  Get your rest but make sure to get up and move around frequently to prevent blood clots and pneumonia.  Work and Return to School You can go back to work when you feel well enough. Discuss the timing with your surgeon. You can usually go back to school or work 1 week after an operation. If your work requires heavy lifting or strenuous activity you need to be placed on light duty for 4 weeks following surgery. You can return to gym class, sports or other physical activities 4 weeks after surgery.  Wound Care Always wash your hands before and after touching near your incision site. Do not soak in a bathtub until cleared at your follow up appointment. You may take a shower 24 hours after surgery. A small amount of drainage from the incision is normal. If the drainage is thick and yellow or the site is red, you may have an infection, so call your surgeon. If you  have a drain in one of your incisions, it will be taken out in office when the drainage stops. Steri-Strips will fall off in 7 to 10 days or they will be removed during your first office visit. If you have dermabond glue covering over the incision, allow the glue to flake off on its own. Avoid wearing tight or rough clothing. It may rub your incisions and make it harder for them to heal. Protect the new skin, especially from the sun. The sun can burn and cause darker scarring. Your scar will heal in about 4 to 6 weeks and will become softer and continue to fade over the next year.  The cosmetic appearance of the incisions will improve over the course of the first year after surgery. Sensation around your incision will return in a few weeks or months.  Bowel Movements After intestinal surgery, you may have loose watery stools for several days. If watery diarrhea lasts longer than 3 days, contact your surgeon. Pain medication (narcotics) can cause constipation. Increase the fiber in your diet with high-fiber foods if you are constipated. You can take an over the counter stool softener like Colace to avoid constipation.  Additional over the counter medications can also be used if Colace isn't sufficient (for example, Milk of Magnesia or Miralax).  Pain The amount of pain is different for each person. Some people need only 1 to 3 doses of pain control medication, while others need more. Take alternating doses of tylenol   and ibuprofen around the clock for the first five days following surgery.  This will provide a baseline of pain control and help with inflammation.  Take the narcotic pain medication in addition if needed for severe pain.  Contact Your Surgeon at [phone], if you have: Pain in your right upper abdomen like a gallbladder attack. Pain that will not go away Pain that gets worse A fever of more than 101F (38.3C) Repeated vomiting Swelling, redness, bleeding, or bad-smelling  drainage from your wound site Strong abdominal pain No bowel movement or unable to pass gas for 3 days Watery diarrhea lasting longer than 3 days  Pain Control The goal of pain control is to minimize pain, keep you moving and help you heal. Your surgical team will work with you on your pain plan. Most often a combination of therapies and medications are used to control your pain. You may also be given medication (local anesthetic) at the surgical site. This may help control your pain for several days. Extreme pain puts extra stress on your body at a time when your body needs to focus on healing. Do not wait until your pain has reached a level "10" or is unbearable before telling your doctor or nurse. It is much easier to control pain before it becomes severe. Following a laparoscopic procedure, pain is sometimes felt in the shoulder. This is due to the gas inserted into your abdomen during the procedure. Moving and walking helps to decrease the gas and the right shoulder pain.  Use the guide below for ways to manage your post-operative pain. Learn more by going to facs.org/safepaincontrol.  How Intense Is My Pain Common Therapies to Feel Better       I hardly notice my pain, and it does not interfere with my activities.  I notice my pain and it distracts me, but I can still do activities (sitting up, walking, standing).  Non-Medication Therapies  Ice (in a bag, applied over clothing at the surgical site), elevation, rest, meditation, massage, distraction (music, TV, play) walking and mild exercise Splinting the abdomen with pillows +  Non-Opioid Medications Acetaminophen (Tylenol) Non-steroidal anti-inflammatory drugs (NSAIDS) Aspirin, Ibuprofen (Motrin, Advil) Naproxen (Aleve) Take these as needed, when you feel pain. Both acetaminophen and NSAIDs help to decrease pain and swelling (inflammation).      My pain is hard to ignore and is more noticeable even when I rest.  My  pain interferes with my usual activities.  Non-Medication Therapies  +  Non-Opioid medications  Take on a regular schedule (around-the-clock) instead of as needed. (For example, Tylenol every 6 hours at 9:00 am, 3:00 pm, 9:00 pm, 3:00 am and Motrin every 6 hours at 12:00 am, 6:00 am, 12:00 pm, 6:00 pm)         I am focused on my pain, and I am not doing my daily activities.  I am groaning in pain, and I cannot sleep. I am unable to do anything.  My pain is as bad as it could be, and nothing else matters.  Non-Medication Therapies  +  Around-the-Clock Non-Opioid Medications  +  Short-acting opioids  Opioids should be used with other medications to manage severe pain. Opioids block pain and give a feeling of euphoria (feel high). Addiction, a serious side effect of opioids, is rare with short-term (a few days) use.  Examples of short-acting opioids include: Tramadol (Ultram), Hydrocodone (Norco, Vicodin), Hydromorphone (Dilaudid), Oxycodone (Oxycontin)     The above directions have been adapted from   the [employer] Surgical Patient Education Program.  Please refer to the ACS website if needed: https://[email or web address].org/-/media/files/education/[email or web address].   Aadam Zhen, MD [facility] [address], [address], [place], [place]  [address] ?  [address] [address], [place], [place]   [address] [phone] ? [phone] ? FAX [phone] Web site: [email or web address]  

## 2021-11-05 NOTE — H&P (Signed)
   Admitting Physician: Nickola Major Elleana Stillson  Service: General Surgery  CC: Abdominal pain  Subjective   HPI: Andrew Goodwin is an 41 y.o. male who is here for cholecystectomy  History reviewed. No pertinent past medical history.  Past Surgical History:  Procedure Laterality Date   NO PAST SURGERIES      Family History  Problem Relation Age of Onset   Colon cancer Neg Hx    Esophageal cancer Neg Hx    Liver cancer Neg Hx    Pancreatic cancer Neg Hx    Rectal cancer Neg Hx     Social:  reports that he has never smoked. He has never used smokeless tobacco. He reports current alcohol use of about 1.0 standard drink of alcohol per week. He reports that he does not use drugs.  Allergies: No Known Allergies  Medications: Current Outpatient Medications  Medication Instructions   aspirin-acetaminophen-caffeine (EXCEDRIN MIGRAINE) 250-250-65 MG tablet 1 tablet, Oral, Daily PRN   dicyclomine (BENTYL) 20 mg, Oral, Every 6 hours    ROS - all of the below systems have been reviewed with the patient and positives are indicated with bold text General: chills, fever or night sweats Eyes: blurry vision or double vision ENT: epistaxis or sore throat Allergy/Immunology: itchy/watery eyes or nasal congestion Hematologic/Lymphatic: bleeding problems, blood clots or swollen lymph nodes Endocrine: temperature intolerance or unexpected weight changes Breast: new or changing breast lumps or nipple discharge Resp: cough, shortness of breath, or wheezing CV: chest pain or dyspnea on exertion GI: as per HPI GU: dysuria, trouble voiding, or hematuria MSK: joint pain or joint stiffness Neuro: TIA or stroke symptoms Derm: pruritus and skin lesion changes Psych: anxiety and depression  Objective   PE Blood pressure 130/89, pulse 61, temperature 97.9 F (36.6 C), temperature source Oral, resp. rate 17, height 6\' 1"  (1.854 m), weight 115.7 kg, SpO2 97 %. Constitutional: NAD; conversant; no  deformities Eyes: Moist conjunctiva; no lid lag; anicteric; PERRL Neck: Trachea midline; no thyromegaly Lungs: Normal respiratory effort; no tactile fremitus CV: RRR; no palpable thrills; no pitting edema GI: Abd soft, nontender; no palpable hepatosplenomegaly MSK: Normal range of motion of extremities; no clubbing/cyanosis Psychiatric: Appropriate affect; alert and oriented x3 Lymphatic: No palpable cervical or axillary lymphadenopathy  No results found for this or any previous visit (from the past 24 hour(s)).  Imaging Orders  No imaging studies ordered today   Gallstones on ultrasound  Assessment and Plan   Andrew Goodwin is an 41 y.o. male with gallstones, here for laparoscopic cholecystectomy.  We discussed the procedure itself as well as its risks, benefits and alternatives and the patient granted consent to proceed.    ICD-10-CM   1. Pre-diabetes  R73.03 CBG per Guidelines for Diabetes Management for Patients Undergoing Surgery ([facility], [facility], and [facility] only)    CBG per Guidelines for Diabetes Management for Patients Undergoing Surgery ([facility], [facility], and [facility] only)       Felicie Morn, [facility] Use [email or web address] to contact on call provider

## 2021-11-05 NOTE — Anesthesia Procedure Notes (Signed)
Procedure Name: Intubation Date/Time: 11/05/2021 7:29 AM  Performed by: West Pugh, CRNAPre-anesthesia Checklist: Patient identified, Emergency Drugs available, Suction available, Patient being monitored and Timeout performed Patient Re-evaluated:Patient Re-evaluated prior to induction Oxygen Delivery Method: Circle system utilized Preoxygenation: Pre-oxygenation with 100% oxygen Induction Type: IV induction Ventilation: Mask ventilation without difficulty and Oral airway inserted - appropriate to patient size Laryngoscope Size: Mac and 4 Grade View: Grade I Tube type: Oral Tube size: 7.5 mm Number of attempts: 1 Airway Equipment and Method: Stylet Placement Confirmation: ETT inserted through vocal cords under direct vision, positive ETCO2, CO2 detector and breath sounds checked- equal and bilateral Secured at: 23 cm Tube secured with: Tape Dental Injury: Teeth and Oropharynx as per pre-operative assessment

## 2021-11-06 ENCOUNTER — Encounter (HOSPITAL_COMMUNITY): Payer: Self-pay | Admitting: Surgery

## 2021-11-06 LAB — SURGICAL PATHOLOGY
